# Patient Record
Sex: Male | Born: 1950 | Race: White | Hispanic: No | Marital: Married | Smoking: Never smoker
Health system: Southern US, Community
[De-identification: ages and names within clinical notes are randomized; demographics above are authoritative.]

## PROBLEM LIST (undated history)

## (undated) DIAGNOSIS — J189 Pneumonia, unspecified organism: Secondary | ICD-10-CM

## (undated) DIAGNOSIS — K219 Gastro-esophageal reflux disease without esophagitis: Secondary | ICD-10-CM

## (undated) DIAGNOSIS — C801 Malignant (primary) neoplasm, unspecified: Secondary | ICD-10-CM

## (undated) DIAGNOSIS — M1990 Unspecified osteoarthritis, unspecified site: Secondary | ICD-10-CM

## (undated) HISTORY — PX: OTHER SURGICAL HISTORY: SHX169

---

## 2012-12-16 HISTORY — PX: OTHER SURGICAL HISTORY: SHX169

## 2016-12-05 ENCOUNTER — Telehealth (INDEPENDENT_AMBULATORY_CARE_PROVIDER_SITE_OTHER): Payer: Self-pay | Admitting: *Deleted

## 2016-12-05 NOTE — Telephone Encounter (Signed)
Pt wife called stating pt has medicare part A and B now starting jan. 1

## 2016-12-06 NOTE — Progress Notes (Signed)
Surgery on 12/20/16.  Preop on 12/29/  Needs orders in EPIC Thank You

## 2016-12-06 NOTE — Telephone Encounter (Signed)
noted 

## 2016-12-10 NOTE — Progress Notes (Signed)
Has pre op 12/29-- PLEASE ADD SURGICAL ORDERS IN EPIC  THANKS

## 2016-12-11 ENCOUNTER — Other Ambulatory Visit (INDEPENDENT_AMBULATORY_CARE_PROVIDER_SITE_OTHER): Payer: Self-pay | Admitting: Orthopaedic Surgery

## 2016-12-11 DIAGNOSIS — M1612 Unilateral primary osteoarthritis, left hip: Secondary | ICD-10-CM

## 2016-12-11 NOTE — Progress Notes (Signed)
PLEASE ADD SURGICAL ORDERS IN EPIC-- HAS PRE OP 12/13/16 THANKS

## 2016-12-12 ENCOUNTER — Other Ambulatory Visit (HOSPITAL_COMMUNITY): Payer: Self-pay

## 2016-12-12 NOTE — Patient Instructions (Addendum)
Marcel Skramstad  12/12/2016   Your procedure is scheduled on: Friday 12/20/2016  Report to [facility] Main  Entrance take [place]  elevators to 3rd floor to  [place] at [phone].  Call this number if you have problems the morning of surgery [phone]   Remember: ONLY 1 PERSON MAY GO WITH YOU TO SHORT STAY TO GET  READY MORNING OF [place].  Do not eat food or drink liquids :After Midnight.     Take these medicines the morning of surgery with A SIP OF WATER: omeprazole (Prilosec) if needed.                                You may not have any metal on your body including hair pins and              piercings  Do not wear jewelry, make-up, lotions, powders or perfumes, deodorant             Do not wear nail polish.  Do not shave  48 hours prior to surgery.              Men may shave face and neck.   Do not bring valuables to the hospital. [place].  Contacts, dentures or bridgework may not be worn into surgery.  Leave suitcase in the car. After surgery it may be brought to your room.                Please read over the following fact sheets you were given: _____________________________________________________________________  WHAT IS A BLOOD TRANSFUSION? Blood Transfusion Information  A transfusion is the replacement of blood or some of its parts. Blood is made up of multiple cells which provide different functions.  Red blood cells carry oxygen and are used for blood loss replacement.  White blood cells fight against infection.  Platelets control bleeding.  Plasma helps clot blood.  Other blood products are available for specialized needs, such as hemophilia or other clotting disorders. BEFORE THE TRANSFUSION  Who gives blood for transfusions?   Healthy volunteers who are fully evaluated to make sure their blood is safe. This is blood bank blood. Transfusion therapy is the safest it has ever  been in the practice of medicine. Before blood is taken from a donor, a complete history is taken to make sure that person has no history of diseases nor engages in risky social behavior (examples are intravenous drug use or sexual activity with multiple partners). The donor's travel history is screened to minimize risk of transmitting infections, such as malaria. The donated blood is tested for signs of infectious diseases, such as HIV and hepatitis. The blood is then tested to be sure it is compatible with you in order to minimize the chance of a transfusion reaction. If you or a relative donates blood, this is often done in anticipation of surgery and is not appropriate for emergency situations. It takes many days to process the donated blood. RISKS AND COMPLICATIONS Although transfusion therapy is very safe and saves many lives, the main dangers of transfusion include:   Getting an infectious disease.  Developing a transfusion reaction. This is an allergic reaction to something in the blood you were given. Every  precaution is taken to prevent this. The decision to have a blood transfusion has been considered carefully by your caregiver before blood is given. Blood is not given unless the benefits outweigh the risks. AFTER THE TRANSFUSION  Right after receiving a blood transfusion, you will usually feel much better and more energetic. This is especially true if your red blood cells have gotten low (anemic). The transfusion raises the level of the red blood cells which carry oxygen, and this usually causes an energy increase.  The nurse administering the transfusion will monitor you carefully for complications. HOME CARE INSTRUCTIONS  No special instructions are needed after a transfusion. You may find your energy is better. Speak with your caregiver about any limitations on activity for underlying diseases you may have. SEEK MEDICAL CARE IF:   Your condition is not improving after your  transfusion.  You develop redness or irritation at the intravenous (IV) site. SEEK IMMEDIATE MEDICAL CARE IF:  Any of the following symptoms occur over the next 12 hours:  Shaking chills.  You have a temperature by mouth above 102 F (38.9 C), not controlled by medicine.  Chest, back, or muscle pain.  People around you feel you are not acting correctly or are confused.  Shortness of breath or difficulty breathing.  Dizziness and fainting.  You get a rash or develop hives.  You have a decrease in urine output.  Your urine turns a dark color or changes to pink, red, or brown. Any of the following symptoms occur over the next 10 days:  You have a temperature by mouth above 102 F (38.9 C), not controlled by medicine.  Shortness of breath.  Weakness after normal activity.  The white part of the eye turns yellow (jaundice).  You have a decrease in the amount of urine or are urinating less often.  Your urine turns a dark color or changes to pink, red, or brown. Document Released: 11/29/2000 Document Revised: 02/24/2012 Document Reviewed: 07/18/2008 ExitCare Patient Information 2014 [place].  _______________________________________________________________________            [facility] - Preparing for Surgery Before surgery, you can play an important role.  Because skin is not sterile, your skin needs to be as free of germs as possible.  You can reduce the number of germs on your skin by washing with CHG (chlorahexidine gluconate) soap before surgery.  CHG is an antiseptic cleaner which kills germs and bonds with the skin to continue killing germs even after washing. Please DO NOT use if you have an allergy to CHG or antibacterial soaps.  If your skin becomes reddened/irritated stop using the CHG and inform your nurse when you arrive at Short Stay. Do not shave (including legs and underarms) for at least 48 hours prior to the first CHG shower.  You may shave your  face/neck. Please follow these instructions carefully:  1.  Shower with CHG Soap the night before surgery and the  morning of Surgery.  2.  If you choose to wash your hair, wash your hair first as usual with your  normal  shampoo.  3.  After you shampoo, rinse your hair and body thoroughly to remove the  shampoo.                           4.  Use CHG as you would any other liquid soap.  You can apply chg directly  to the skin and wash  Gently with a scrungie or clean washcloth.  5.  Apply the CHG Soap to your body ONLY FROM THE NECK DOWN.   Do not use on face/ open                           Wound or open sores. Avoid contact with eyes, ears mouth and genitals (private parts).                       Wash face,  Genitals (private parts) with your normal soap.             6.  Wash thoroughly, paying special attention to the area where your surgery  will be performed.  7.  Thoroughly rinse your body with warm water from the neck down.  8.  DO NOT shower/wash with your normal soap after using and rinsing off  the CHG Soap.                9.  Pat yourself dry with a clean towel.            10.  Wear clean pajamas.            11.  Place clean sheets on your bed the night of your first shower and do not  sleep with pets. Day of Surgery : Do not apply any lotions/deodorants the morning of surgery.  Please wear clean clothes to the hospital/surgery center.    Incentive Spirometer  An incentive spirometer is a tool that can help keep your lungs clear and active. This tool measures how well you are filling your lungs with each breath. Taking long deep breaths may help reverse or decrease the chance of developing breathing (pulmonary) problems (especially infection) following:  A long period of time when you are unable to move or be active. BEFORE THE PROCEDURE   If the spirometer includes an indicator to show your best effort, your nurse or respiratory therapist will set it to a  desired goal.  If possible, sit up straight or lean slightly forward. Try not to slouch.  Hold the incentive spirometer in an upright position. INSTRUCTIONS FOR USE  1. Sit on the edge of your bed if possible, or sit up as far as you can in bed or on a chair. 2. Hold the incentive spirometer in an upright position. 3. Breathe out normally. 4. Place the mouthpiece in your mouth and seal your lips tightly around it. 5. Breathe in slowly and as deeply as possible, raising the piston or the ball toward the top of the column. 6. Hold your breath for 3-5 seconds or for as long as possible. Allow the piston or ball to fall to the bottom of the column. 7. Remove the mouthpiece from your mouth and breathe out normally. 8. Rest for a few seconds and repeat Steps 1 through 7 at least 10 times every 1-2 hours when you are awake. Take your time and take a few normal breaths between deep breaths. 9. The spirometer may include an indicator to show your best effort. Use the indicator as a goal to work toward during each repetition. 10. After each set of 10 deep breaths, practice coughing to be sure your lungs are clear. If you have an incision (the cut made at the time of surgery), support your incision when coughing by placing a pillow or rolled up towels firmly against it. Once you are able  to get out of bed, walk around indoors and cough well. You may stop using the incentive spirometer when instructed by your caregiver.  RISKS AND COMPLICATIONS  Take your time so you do not get dizzy or light-headed.  If you are in pain, you may need to take or ask for pain medication before doing incentive spirometry. It is harder to take a deep breath if you are having pain. AFTER USE  Rest and breathe slowly and easily.  It can be helpful to keep track of a log of your progress. Your caregiver can provide you with a simple table to help with this. If you are using the spirometer at home, follow these  instructions: [place] IF:   You are having difficultly using the spirometer.  You have trouble using the spirometer as often as instructed.  Your pain medication is not giving enough relief while using the spirometer.  You develop fever of 100.5 F (38.1 C) or higher. SEEK IMMEDIATE MEDICAL CARE IF:   You cough up bloody sputum that had not been present before.  You develop fever of 102 F (38.9 C) or greater.  You develop worsening pain at or near the incision site. MAKE SURE YOU:   Understand these instructions.  Will watch your condition.  Will get help right away if you are not doing well or get worse. Document Released: 04/14/2007 Document Revised: 02/24/2012 Document Reviewed: 06/15/2007 ExitCare Patient Information 2014 [place].   ________________________________________________________________________   FAILURE TO FOLLOW THESE INSTRUCTIONS MAY RESULT IN THE CANCELLATION OF YOUR SURGERY PATIENT SIGNATURE_________________________________  NURSE SIGNATURE__________________________________  ________________________________________________________________________

## 2016-12-13 ENCOUNTER — Encounter (HOSPITAL_COMMUNITY)
Admission: RE | Admit: 2016-12-13 | Discharge: 2016-12-13 | Disposition: A | Discharge status: Home / Self Care | Payer: BLUE CROSS/BLUE SHIELD | Source: Ambulatory Visit | Attending: Orthopaedic Surgery | Admitting: Orthopaedic Surgery

## 2016-12-13 ENCOUNTER — Encounter (HOSPITAL_COMMUNITY): Payer: Self-pay

## 2016-12-13 DIAGNOSIS — Z01812 Encounter for preprocedural laboratory examination: Secondary | ICD-10-CM | POA: Insufficient documentation

## 2016-12-13 DIAGNOSIS — M1612 Unilateral primary osteoarthritis, left hip: Secondary | ICD-10-CM | POA: Diagnosis not present

## 2016-12-13 HISTORY — DX: Gastro-esophageal reflux disease without esophagitis: K21.9

## 2016-12-13 HISTORY — DX: Pneumonia, unspecified organism: J18.9

## 2016-12-13 HISTORY — DX: Unspecified osteoarthritis, unspecified site: M19.90

## 2016-12-13 HISTORY — DX: Malignant (primary) neoplasm, unspecified: C80.1

## 2016-12-13 LAB — CBC
HEMATOCRIT: 46 % (ref 39.0–52.0)
HEMOGLOBIN: 15.5 g/dL (ref 13.0–17.0)
MCH: 30.4 pg (ref 26.0–34.0)
MCHC: 33.7 g/dL (ref 30.0–36.0)
MCV: 90.2 fL (ref 78.0–100.0)
Platelets: 251 10*3/uL (ref 150–400)
RBC: 5.1 MIL/uL (ref 4.22–5.81)
RDW: 13.2 % (ref 11.5–15.5)
WBC: 12.6 10*3/uL — ABNORMAL HIGH (ref 4.0–10.5)

## 2016-12-13 LAB — BASIC METABOLIC PANEL
ANION GAP: 7 (ref 5–15)
BUN: 17 mg/dL (ref 6–20)
CALCIUM: 9 mg/dL (ref 8.9–10.3)
CHLORIDE: 102 mmol/L (ref 101–111)
CO2: 28 mmol/L (ref 22–32)
Creatinine, Ser: 1.16 mg/dL (ref 0.61–1.24)
GFR calc non Af Amer: >60 mL/min (ref >60)
GLUCOSE: 75 mg/dL (ref 65–99)
POTASSIUM: 4.8 mmol/L (ref 3.5–5.1)
Sodium: 137 mmol/L (ref 135–145)

## 2016-12-13 LAB — SURGICAL PCR SCREEN
MRSA, PCR: NEGATIVE
Staphylococcus aureus: NEGATIVE

## 2016-12-13 LAB — ABO/RH: ABO/RH(D): A NEG

## 2016-12-20 ENCOUNTER — Inpatient Hospital Stay (HOSPITAL_COMMUNITY)
Admission: RE | Admit: 2016-12-20 | Discharge: 2016-12-22 | DRG: 470 | Disposition: A | Discharge status: Home Health | Payer: BLUE CROSS/BLUE SHIELD | Source: Ambulatory Visit | Attending: Orthopaedic Surgery | Admitting: Orthopaedic Surgery

## 2016-12-20 ENCOUNTER — Encounter (HOSPITAL_COMMUNITY): Payer: Self-pay | Admitting: *Deleted

## 2016-12-20 ENCOUNTER — Inpatient Hospital Stay (HOSPITAL_COMMUNITY): Payer: BLUE CROSS/BLUE SHIELD | Admitting: Certified Registered Nurse Anesthetist

## 2016-12-20 ENCOUNTER — Inpatient Hospital Stay (HOSPITAL_COMMUNITY): Payer: BLUE CROSS/BLUE SHIELD

## 2016-12-20 ENCOUNTER — Encounter (HOSPITAL_COMMUNITY)
Admission: RE | Disposition: A | Discharge status: Home Health | Payer: Self-pay | Source: Ambulatory Visit | Attending: Orthopaedic Surgery

## 2016-12-20 DIAGNOSIS — M1612 Unilateral primary osteoarthritis, left hip: Secondary | ICD-10-CM | POA: Diagnosis present

## 2016-12-20 DIAGNOSIS — Z419 Encounter for procedure for purposes other than remedying health state, unspecified: Secondary | ICD-10-CM

## 2016-12-20 DIAGNOSIS — Z79899 Other long term (current) drug therapy: Secondary | ICD-10-CM

## 2016-12-20 DIAGNOSIS — K219 Gastro-esophageal reflux disease without esophagitis: Secondary | ICD-10-CM | POA: Diagnosis present

## 2016-12-20 DIAGNOSIS — Z85828 Personal history of other malignant neoplasm of skin: Secondary | ICD-10-CM

## 2016-12-20 DIAGNOSIS — Z96642 Presence of left artificial hip joint: Secondary | ICD-10-CM

## 2016-12-20 DIAGNOSIS — M25552 Pain in left hip: Secondary | ICD-10-CM | POA: Diagnosis present

## 2016-12-20 HISTORY — PX: TOTAL HIP ARTHROPLASTY: SHX124

## 2016-12-20 SURGERY — ARTHROPLASTY, HIP, TOTAL, ANTERIOR APPROACH
Anesthesia: Spinal | Site: Hip | Laterality: Left

## 2016-12-20 MED ORDER — MIDAZOLAM HCL 2 MG/2ML IJ SOLN
INTRAMUSCULAR | Status: AC
  Filled 2016-12-20: qty 2

## 2016-12-20 MED ORDER — ASPIRIN 81 MG PO CHEW
81.0000 mg | CHEWABLE_TABLET | Freq: Two times a day (BID) | ORAL | Status: DC
Start: 2016-12-20 — End: 2016-12-22
  Administered 2016-12-20 – 2016-12-22 (×4): 81 mg via ORAL
  Filled 2016-12-20 (×4): qty 1

## 2016-12-20 MED ORDER — CEFAZOLIN SODIUM-DEXTROSE 2-4 GM/100ML-% IV SOLN
2.0000 g | INTRAVENOUS | Status: AC
Start: 2016-12-20 — End: 2016-12-20
  Administered 2016-12-20: 2 g via INTRAVENOUS

## 2016-12-20 MED ORDER — MIDAZOLAM HCL 5 MG/5ML IJ SOLN
INTRAMUSCULAR | Status: DC | PRN
  Administered 2016-12-20: 2 mg via INTRAVENOUS

## 2016-12-20 MED ORDER — CEFAZOLIN IN D5W 1 GM/50ML IV SOLN
1.0000 g | Freq: Four times a day (QID) | INTRAVENOUS | Status: AC
  Administered 2016-12-20 (×2): 1 g via INTRAVENOUS
  Filled 2016-12-20 (×2): qty 50

## 2016-12-20 MED ORDER — PHENOL 1.4 % MT LIQD
1.0000 | OROMUCOSAL | Status: DC | PRN
  Filled 2016-12-20: qty 177

## 2016-12-20 MED ORDER — SODIUM CHLORIDE 0.9 % IR SOLN
Status: DC | PRN
  Administered 2016-12-20: 1000 mL

## 2016-12-20 MED ORDER — PHENYLEPHRINE HCL 10 MG/ML IJ SOLN
INTRAMUSCULAR | Status: DC | PRN
  Administered 2016-12-20 (×3): 80 ug via INTRAVENOUS

## 2016-12-20 MED ORDER — ONDANSETRON HCL 4 MG PO TABS: 4.0000 mg | ORAL_TABLET | Freq: Four times a day (QID) | ORAL | Status: DC | PRN

## 2016-12-20 MED ORDER — ONDANSETRON HCL 4 MG/2ML IJ SOLN
4.0000 mg | Freq: Four times a day (QID) | INTRAMUSCULAR | Status: DC | PRN
  Administered 2016-12-20 – 2016-12-21 (×2): 4 mg via INTRAVENOUS
  Filled 2016-12-20 (×2): qty 2

## 2016-12-20 MED ORDER — PANTOPRAZOLE SODIUM 40 MG PO TBEC
40.0000 mg | DELAYED_RELEASE_TABLET | Freq: Every day | ORAL | Status: DC
  Administered 2016-12-21 – 2016-12-22 (×2): 40 mg via ORAL
  Filled 2016-12-20 (×2): qty 1

## 2016-12-20 MED ORDER — CEFAZOLIN SODIUM-DEXTROSE 2-4 GM/100ML-% IV SOLN
INTRAVENOUS | Status: AC
  Filled 2016-12-20: qty 100

## 2016-12-20 MED ORDER — TRANEXAMIC ACID 1000 MG/10ML IV SOLN
1000.0000 mg | INTRAVENOUS | Status: AC
  Administered 2016-12-20: 1000 mg via INTRAVENOUS
  Filled 2016-12-20: qty 10

## 2016-12-20 MED ORDER — SODIUM CHLORIDE 0.9 % IV SOLN
INTRAVENOUS | Status: DC
  Administered 2016-12-20 (×2): 75 mL/h via INTRAVENOUS

## 2016-12-20 MED ORDER — METOCLOPRAMIDE HCL 5 MG/ML IJ SOLN
INTRAMUSCULAR | Status: AC
  Filled 2016-12-20: qty 2

## 2016-12-20 MED ORDER — LACTATED RINGERS IV SOLN
INTRAVENOUS | Status: DC
  Administered 2016-12-20: 10:00:00 via INTRAVENOUS

## 2016-12-20 MED ORDER — OXYCODONE HCL 5 MG PO TABS
5.0000 mg | ORAL_TABLET | ORAL | Status: DC | PRN
  Administered 2016-12-20: 21:00:00 5 mg via ORAL
  Administered 2016-12-20: 10 mg via ORAL
  Administered 2016-12-20: 12:00:00 5 mg via ORAL
  Administered 2016-12-20: 16:00:00 10 mg via ORAL
  Administered 2016-12-20 – 2016-12-21 (×2): 5 mg via ORAL
  Filled 2016-12-20: qty 2
  Filled 2016-12-20: qty 1
  Filled 2016-12-20: qty 2
  Filled 2016-12-20 (×3): qty 1

## 2016-12-20 MED ORDER — METOCLOPRAMIDE HCL 5 MG/ML IJ SOLN
10.0000 mg | Freq: Once | INTRAMUSCULAR | Status: AC | PRN
  Administered 2016-12-20: 10 mg via INTRAVENOUS

## 2016-12-20 MED ORDER — FENTANYL CITRATE (PF) 100 MCG/2ML IJ SOLN
INTRAMUSCULAR | Status: AC
  Filled 2016-12-20: qty 2

## 2016-12-20 MED ORDER — ACETAMINOPHEN 325 MG PO TABS
650.0000 mg | ORAL_TABLET | Freq: Four times a day (QID) | ORAL | Status: DC | PRN
  Administered 2016-12-21 – 2016-12-22 (×4): 650 mg via ORAL
  Filled 2016-12-20 (×4): qty 2

## 2016-12-20 MED ORDER — METHOCARBAMOL 500 MG PO TABS
500.0000 mg | ORAL_TABLET | Freq: Four times a day (QID) | ORAL | Status: DC | PRN
  Administered 2016-12-20 – 2016-12-22 (×5): 500 mg via ORAL
  Filled 2016-12-20 (×5): qty 1

## 2016-12-20 MED ORDER — METOCLOPRAMIDE HCL 5 MG PO TABS: 5.0000 mg | ORAL_TABLET | Freq: Three times a day (TID) | ORAL | Status: DC | PRN

## 2016-12-20 MED ORDER — DOCUSATE SODIUM 100 MG PO CAPS
100.0000 mg | ORAL_CAPSULE | Freq: Two times a day (BID) | ORAL | Status: DC
  Administered 2016-12-20 – 2016-12-22 (×5): 100 mg via ORAL
  Filled 2016-12-20 (×5): qty 1

## 2016-12-20 MED ORDER — ACETAMINOPHEN 650 MG RE SUPP: 650.0000 mg | Freq: Four times a day (QID) | RECTAL | Status: DC | PRN

## 2016-12-20 MED ORDER — DEXTROSE 5 % IV SOLN
500.0000 mg | Freq: Four times a day (QID) | INTRAVENOUS | Status: DC | PRN
  Administered 2016-12-20: 500 mg via INTRAVENOUS
  Filled 2016-12-20: qty 550
  Filled 2016-12-20: qty 5

## 2016-12-20 MED ORDER — PROPOFOL 10 MG/ML IV BOLUS
INTRAVENOUS | Status: AC
  Filled 2016-12-20: qty 20

## 2016-12-20 MED ORDER — MEPERIDINE HCL 50 MG/ML IJ SOLN
INTRAMUSCULAR | Status: AC
  Filled 2016-12-20: qty 1

## 2016-12-20 MED ORDER — DIPHENHYDRAMINE HCL 12.5 MG/5ML PO ELIX: 12.5000 mg | ORAL_SOLUTION | ORAL | Status: DC | PRN

## 2016-12-20 MED ORDER — FENTANYL CITRATE (PF) 100 MCG/2ML IJ SOLN: 25.0000 ug | INTRAMUSCULAR | Status: DC | PRN

## 2016-12-20 MED ORDER — HYDROMORPHONE HCL 1 MG/ML IJ SOLN: 1.0000 mg | INTRAMUSCULAR | Status: DC | PRN

## 2016-12-20 MED ORDER — MEPERIDINE HCL 50 MG/ML IJ SOLN
6.2500 mg | INTRAMUSCULAR | Status: DC | PRN
  Administered 2016-12-20: 12.5 mg via INTRAVENOUS

## 2016-12-20 MED ORDER — METOCLOPRAMIDE HCL 5 MG/ML IJ SOLN: 5.0000 mg | Freq: Three times a day (TID) | INTRAMUSCULAR | Status: DC | PRN

## 2016-12-20 MED ORDER — BUPIVACAINE HCL (PF) 0.5 % IJ SOLN
INTRAMUSCULAR | Status: DC | PRN
  Administered 2016-12-20: 3 mL

## 2016-12-20 MED ORDER — KETOROLAC TROMETHAMINE 15 MG/ML IJ SOLN
7.5000 mg | Freq: Four times a day (QID) | INTRAMUSCULAR | Status: AC
  Administered 2016-12-20 – 2016-12-21 (×4): 7.5 mg via INTRAVENOUS
  Filled 2016-12-20 (×4): qty 1

## 2016-12-20 MED ORDER — ALUM & MAG HYDROXIDE-SIMETH 200-200-20 MG/5ML PO SUSP: 30.0000 mL | ORAL | Status: DC | PRN

## 2016-12-20 MED ORDER — FENTANYL CITRATE (PF) 100 MCG/2ML IJ SOLN
INTRAMUSCULAR | Status: DC | PRN
  Administered 2016-12-20 (×2): 50 ug via INTRAVENOUS

## 2016-12-20 MED ORDER — LACTATED RINGERS IV SOLN
INTRAVENOUS | Status: DC | PRN
  Administered 2016-12-20 (×2): via INTRAVENOUS

## 2016-12-20 MED ORDER — STERILE WATER FOR IRRIGATION IR SOLN
Status: DC | PRN
  Administered 2016-12-20: 2000 mL

## 2016-12-20 MED ORDER — PROPOFOL 500 MG/50ML IV EMUL
INTRAVENOUS | Status: DC | PRN
  Administered 2016-12-20: 75 ug/kg/min via INTRAVENOUS

## 2016-12-20 MED ORDER — MENTHOL 3 MG MT LOZG: 1.0000 | LOZENGE | OROMUCOSAL | Status: DC | PRN

## 2016-12-20 MED ORDER — PROPOFOL 10 MG/ML IV BOLUS
INTRAVENOUS | Status: AC
  Filled 2016-12-20: qty 40

## 2016-12-20 SURGICAL SUPPLY — 38 items
BENZOIN TINCTURE PRP APPL 2/3 (GAUZE/BANDAGES/DRESSINGS) ×2 IMPLANT
BLADE SAW SGTL 18X1.27X75 (BLADE) ×2 IMPLANT
CAPT HIP TOTAL 2 ×2 IMPLANT
CELLS DAT CNTRL 66122 CELL SVR (MISCELLANEOUS) ×1 IMPLANT
CLOTH BEACON ORANGE TIMEOUT ST (SAFETY) ×2 IMPLANT
COVER PERINEAL POST (MISCELLANEOUS) ×2 IMPLANT
DRAPE STERI IOBAN 125X83 (DRAPES) ×2 IMPLANT
DRAPE U-SHAPE 47X51 STRL (DRAPES) ×4 IMPLANT
DRSG AQUACEL AG ADV 3.5X10 (GAUZE/BANDAGES/DRESSINGS) ×2 IMPLANT
DURAPREP 26ML APPLICATOR (WOUND CARE) ×2 IMPLANT
ELECT REM PT RETURN 9FT ADLT (ELECTROSURGICAL) ×2
ELECTRODE REM PT RTRN 9FT ADLT (ELECTROSURGICAL) ×1 IMPLANT
GLOVE BIO SURGEON STRL SZ7.5 (GLOVE) ×2 IMPLANT
GLOVE BIOGEL PI IND STRL 7.0 (GLOVE) ×1 IMPLANT
GLOVE BIOGEL PI IND STRL 7.5 (GLOVE) ×3 IMPLANT
GLOVE BIOGEL PI IND STRL 8 (GLOVE) ×2 IMPLANT
GLOVE BIOGEL PI INDICATOR 7.0 (GLOVE) ×1
GLOVE BIOGEL PI INDICATOR 7.5 (GLOVE) ×3
GLOVE BIOGEL PI INDICATOR 8 (GLOVE) ×2
GLOVE ECLIPSE 8.0 STRL XLNG CF (GLOVE) ×2 IMPLANT
GLOVE SURG SS PI 7.0 STRL IVOR (GLOVE) ×2 IMPLANT
GLOVE SURG SS PI 7.5 STRL IVOR (GLOVE) ×2 IMPLANT
GOWN STRL REUS W/ TWL XL LVL3 (GOWN DISPOSABLE) ×1 IMPLANT
GOWN STRL REUS W/TWL XL LVL3 (GOWN DISPOSABLE) ×7 IMPLANT
HANDPIECE INTERPULSE COAX TIP (DISPOSABLE) ×1
HOLDER FOLEY CATH W/STRAP (MISCELLANEOUS) ×2 IMPLANT
PACK ANTERIOR HIP CUSTOM (KITS) ×2 IMPLANT
RTRCTR WOUND ALEXIS 18CM MED (MISCELLANEOUS) ×2
SET HNDPC FAN SPRY TIP SCT (DISPOSABLE) ×1 IMPLANT
STRIP CLOSURE SKIN 1/2X4 (GAUZE/BANDAGES/DRESSINGS) ×2 IMPLANT
SUT ETHIBOND NAB CT1 #1 30IN (SUTURE) ×2 IMPLANT
SUT MNCRL AB 4-0 PS2 18 (SUTURE) ×2 IMPLANT
SUT VIC AB 0 CT1 36 (SUTURE) ×2 IMPLANT
SUT VIC AB 1 CT1 36 (SUTURE) ×2 IMPLANT
SUT VIC AB 2-0 CT1 27 (SUTURE) ×2
SUT VIC AB 2-0 CT1 TAPERPNT 27 (SUTURE) ×2 IMPLANT
TRAY FOLEY W/METER SILVER 16FR (SET/KITS/TRAYS/PACK) ×2 IMPLANT
YANKAUER SUCT BULB TIP 10FT TU (MISCELLANEOUS) ×2 IMPLANT

## 2016-12-20 NOTE — Transfer of Care (Signed)
Immediate Anesthesia Transfer of Care Note  Patient: Mathew Perez  Procedure(s) Performed: Procedure(s): LEFT TOTAL HIP ARTHROPLASTY ANTERIOR APPROACH (Left)  Patient Location: PACU  Anesthesia Type:Spinal  Level of Consciousness: awake, alert  and oriented  Airway & Oxygen Therapy: Patient Spontanous Breathing and Patient connected to face mask oxygen  Post-op Assessment: Report given to RN and Post -op Vital signs reviewed and stable  Post vital signs: Reviewed and stable  Last Vitals:  Vitals:   12/20/16 0545  BP: (!) 144/95  Pulse: 87  Resp: 18  Temp: 36.4 C    Last Pain:  Vitals:   12/20/16 0545  TempSrc: Oral  PainSc: 2       Patients Stated Pain Goal: 4 ([id number])  Complications: No apparent anesthesia complications

## 2016-12-20 NOTE — Brief Op Note (Signed)
12/20/2016  8:55 AM  PATIENT:  Bishop Dublin  66 y.o. male  PRE-OPERATIVE DIAGNOSIS:  severe osteoarthritis left hip  POST-OPERATIVE DIAGNOSIS:  severe osteoarthritis left hip  PROCEDURE:  Procedure(s): LEFT TOTAL HIP ARTHROPLASTY ANTERIOR APPROACH (Left)  SURGEON:  Surgeon(s) and Role:    * Mcarthur Rossetti, MD - Primary  PHYSICIAN ASSISTANT: Benita Stabile, PA-C  ANESTHESIA:   spinal  EBL:  Total I/O In: 1000 [I.V.:1000] Out: 500 [Urine:200; Blood:300]  COUNTS:  YES  TOURNIQUET:  * No tourniquets in log *  DICTATION: .Other Dictation: Dictation Number [id number]  PLAN OF CARE: Admit to inpatient   PATIENT DISPOSITION:  PACU - hemodynamically stable.   Delay start of Pharmacological VTE agent (>24hrs) due to surgical blood loss or risk of bleeding: no

## 2016-12-20 NOTE — Anesthesia Procedure Notes (Signed)
Spinal  Start time: 12/20/2016 7:35 AM End time: 12/20/2016 7:41 AM Staffing Resident/CRNA: Gean Maidens Performed: resident/CRNA  Preanesthetic Checklist Completed: patient identified, site marked, surgical consent, pre-op evaluation, timeout performed, IV checked, risks and benefits discussed and monitors and equipment checked Spinal Block Patient position: sitting Prep: Betadine Patient monitoring: heart rate, continuous pulse ox and blood pressure Approach: midline Location: L4-5 Injection technique: single-shot Needle Needle type: Spinocan  Needle gauge: 22 G Needle length: 9 cm Needle insertion depth: 6 cm Additional Notes Pt sitting position, sterile prep and drape. Negative paresthesia, negative heme.

## 2016-12-20 NOTE — Evaluation (Signed)
Physical Therapy Evaluation Patient Details Name: Mathew Perez MRN: [id number] DOB: 04/14/1951 Today's Date: 12/20/2016   History of Present Illness  Pt s/p L THR  Clinical Impression  Pt s/p L THR presents with decreased L LE strength/ROM and post op pain limiting functional mobility.  Pt should progress to dc home with family assist and HHPT follow up.    Follow Up Recommendations Home health PT    Equipment Recommendations  Rolling walker with 5" wheels    Recommendations for Other Services OT consult     Precautions / Restrictions Precautions Precautions: Fall Restrictions Weight Bearing Restrictions: No Other Position/Activity Restrictions: WBAT      Mobility  Bed Mobility Overal bed mobility: Needs Assistance Bed Mobility: Supine to Sit     Supine to sit: Min assist;+2 for physical assistance;+2 for safety/equipment     General bed mobility comments: cues for sequence and use of R LE to self assist  Transfers Overall transfer level: Needs assistance Equipment used: Rolling walker (2 wheeled) Transfers: Sit to/from Stand Sit to Stand: Min assist;From elevated surface         General transfer comment: cues for LE management and use of UEs to self assist  Ambulation/Gait Ambulation/Gait assistance: Min assist Ambulation Distance (Feet): 34 Feet Assistive device: Rolling walker (2 wheeled) Gait Pattern/deviations: Step-to pattern;Decreased step length - right;Decreased step length - left;Shuffle;Trunk flexed Gait velocity: decr Gait velocity interpretation: Below normal speed for age/gender General Gait Details: cues for sequence, posture and position from [employer]            Wheelchair Mobility    Modified Rankin (Stroke Patients Only)       Balance                                             Pertinent Vitals/Pain Pain Assessment: 0-10 Pain Score: 5  Pain Location: L hip Pain Descriptors / Indicators:  Aching;Sore Pain Intervention(s): Limited activity within patient's tolerance;Monitored during session;Premedicated before session;Ice applied    Home Living Family/patient expects to be discharged to:: Private residence Living Arrangements: Spouse/significant other Available Help at Discharge: Family Type of Home: House Home Access: Stairs to enter   Technical brewer of Steps: 1 Home Layout: One level Home Equipment: None      Prior Function Level of Independence: Independent               Hand Dominance        Extremity/Trunk Assessment   Upper Extremity Assessment Upper Extremity Assessment: Overall WFL for tasks assessed    Lower Extremity Assessment Lower Extremity Assessment: LLE deficits/detail    Cervical / Trunk Assessment Cervical / Trunk Assessment: Normal  Communication   Communication: No difficulties  Cognition Arousal/Alertness: Awake/alert Behavior During Therapy: WFL for tasks assessed/performed Overall Cognitive Status: Within Functional Limits for tasks assessed                      General Comments      Exercises Total Joint Exercises Ankle Circles/Pumps: AROM;Both;15 reps;Supine   Assessment/Plan    PT Assessment Patient needs continued PT services  PT Problem List Decreased strength;Decreased range of motion;Decreased activity tolerance;Decreased balance;Decreased mobility;Decreased knowledge of use of DME;Pain          PT Treatment Interventions DME instruction;Gait training;Stair training;Functional mobility training;Therapeutic activities;Therapeutic exercise;Patient/family education  PT Goals (Current goals can be found in the Care Plan section)  Acute Rehab PT Goals Patient Stated Goal: Regain IND PT Goal Formulation: With patient Time For Goal Achievement: 12/23/16 Potential to Achieve Goals: Good    Frequency 7X/week   Barriers to discharge        Co-evaluation               End of  Session Equipment Utilized During Treatment: Gait belt Activity Tolerance: Patient tolerated treatment well Patient left: in chair;with call bell/phone within reach;with chair alarm set;with family/visitor present Nurse Communication: Mobility status         Time: [id number] PT Time Calculation (min) (ACUTE ONLY): 34 min   Charges:   PT Evaluation $PT Eval Low Complexity: 1 Procedure PT Treatments $Gait Training: 8-22 mins   PT G Codes:        Tahsin Benyo 2016/12/25, 5:06 PM

## 2016-12-20 NOTE — Anesthesia Preprocedure Evaluation (Signed)
Anesthesia Evaluation  Patient identified by MRN, date of birth, ID band Patient awake    Reviewed: Allergy & Precautions, NPO status , Patient's Chart, lab work & pertinent test results  Airway Mallampati: II  TM Distance: >3 FB Neck ROM: Full    Dental no notable dental hx.    Pulmonary neg pulmonary ROS,    Pulmonary exam normal breath sounds clear to auscultation       Cardiovascular negative cardio ROS Normal cardiovascular exam Rhythm:Regular Rate:Normal     Neuro/Psych negative neurological ROS  negative psych ROS   GI/Hepatic negative GI ROS, Neg liver ROS,   Endo/Other  negative endocrine ROS  Renal/GU negative Renal ROS  negative genitourinary   Musculoskeletal negative musculoskeletal ROS (+)   Abdominal   Peds negative pediatric ROS (+)  Hematology negative hematology ROS (+)   Anesthesia Other Findings   Reproductive/Obstetrics negative OB ROS                            Anesthesia Physical Anesthesia Plan  ASA: II  Anesthesia Plan: Spinal   Post-op Pain Management:    Induction:   Airway Management Planned: Simple Face Mask  Additional Equipment:   Intra-op Plan:   Post-operative Plan: Extubation in OR  Informed Consent: I have reviewed the patients History and Physical, chart, labs and discussed the procedure including the risks, benefits and alternatives for the proposed anesthesia with the patient or authorized representative who has indicated his/her understanding and acceptance.   Dental advisory given  Plan Discussed with: CRNA  Anesthesia Plan Comments:         Anesthesia Quick Evaluation

## 2016-12-20 NOTE — Anesthesia Postprocedure Evaluation (Signed)
Anesthesia Post Note  Patient: Mathew Perez  Procedure(s) Performed: Procedure(s) (LRB): LEFT TOTAL HIP ARTHROPLASTY ANTERIOR APPROACH (Left)  Patient location during evaluation: PACU Anesthesia Type: Spinal Level of consciousness: awake and alert Pain management: pain level controlled Vital Signs Assessment: post-procedure vital signs reviewed and stable Respiratory status: spontaneous breathing and respiratory function stable Cardiovascular status: blood pressure returned to baseline and stable Postop Assessment: no headache, no backache and spinal receding Anesthetic complications: no       Last Vitals:  Vitals:   12/20/16 1115 12/20/16 1125  BP:  122/75  Pulse: 67   Resp: 12 14  Temp:  36.5 C    Last Pain:  Vitals:   12/20/16 0545  TempSrc: Oral  PainSc: 2     LLE Motor Response: Responds to commands (12/20/16 1115) LLE Sensation: Decreased (due to spinal) (12/20/16 1115) RLE Motor Response: Responds to commands (12/20/16 1115) RLE Sensation: Decreased (due to spinal) (12/20/16 1115) L Sensory Level: L4-Anterior knee, lower leg (12/20/16 1115) R Sensory Level: L5-Outer lower leg, top of foot, great toe (12/20/16 1115)  Montez Hageman

## 2016-12-20 NOTE — H&P (Addendum)
TOTAL HIP ADMISSION H&P  Patient is admitted for left total hip arthroplasty.  Subjective:  Chief Complaint: left hip pain  HPI: Mathew Perez, 66 y.o. male, has a history of pain and functional disability in the left hip(s) due to arthritis and patient has failed non-surgical conservative treatments for greater than 12 weeks to include NSAID's and/or analgesics, corticosteriod injections and activity modification.  Onset of symptoms was gradual starting 2 years ago with gradually worsening course since that time.The patient noted no past surgery on the left hip(s).  Patient currently rates pain in the left hip at 10 out of 10 with activity. Patient has night pain, worsening of pain with activity and weight bearing, trendelenberg gait, pain that interfers with activities of daily living, pain with passive range of motion and crepitus. Patient has evidence of subchondral cysts, subchondral sclerosis, periarticular osteophytes and joint space narrowing by imaging studies. This condition presents safety issues increasing the risk of falls.  There is no current active infection.  Patient Active Problem List   Diagnosis Date Noted  . Unilateral primary osteoarthritis, left hip 12/20/2016   Past Medical History:  Diagnosis Date  . Arthritis    oa  . Cancer ([place])    basal cell removed fro face 4 yrs ago  . GERD (gastroesophageal reflux disease)   . Pneumonia as child    Past Surgical History:  Procedure Laterality Date  . colonscopy  2014   every 5 yrs due to polyps  . left rotator cuff repair  6 yrs ago    Prescriptions Prior to Admission  Medication Sig Dispense Refill Last Dose  . diclofenac (VOLTAREN) 75 MG EC tablet Take 75 mg by mouth 2 (two) times daily as needed for mild pain.   Past Week at Unknown time  . ibuprofen (ADVIL,MOTRIN) 800 MG tablet Take 800 mg by mouth daily as needed for mild pain.   Past Month at Unknown time  . omeprazole (PRILOSEC) 40 MG capsule Take 40 mg by mouth  daily as needed (heartburn).   12/20/2016 at 0430   No Known Allergies  Social History  Substance Use Topics  . Smoking status: Never Smoker  . Smokeless tobacco: Never Used  . Alcohol use No    History reviewed. No pertinent family history.   Review of Systems  Musculoskeletal: Positive for joint pain.  All other systems reviewed and are negative.   Objective:  Physical Exam  Constitutional: He is oriented to person, place, and time. He appears well-developed and well-nourished.  HENT:  Head: Normocephalic and atraumatic.  Eyes: EOM are normal. Pupils are equal, round, and reactive to light.  Neck: Normal range of motion. Neck supple.  Cardiovascular: Normal rate and regular rhythm.   Respiratory: Effort normal and breath sounds normal.  GI: Soft. Bowel sounds are normal.  Musculoskeletal:       Left hip: He exhibits decreased range of motion, decreased strength, tenderness and bony tenderness.  Neurological: He is alert and oriented to person, place, and time.  Skin: Skin is warm and dry.  Psychiatric: He has a normal mood and affect.    Vital signs in last 24 hours: Temp:  [97.6 F (36.4 C)] 97.6 F (36.4 C) (01/05 0545) Pulse Rate:  [87] 87 (01/05 0545) Resp:  [18] 18 (01/05 0545) BP: (144)/(95) 144/95 (01/05 0545) SpO2:  [100 %] 100 % (01/05 0545) Weight:  [200 lb (90.7 kg)] 200 lb (90.7 kg) (01/05 0545)  Labs:   Estimated body mass  index is 27.12 kg/m as calculated from the following:   Height as of this encounter: 6' (1.829 m).   Weight as of this encounter: 200 lb (90.7 kg).   Imaging Review Plain radiographs demonstrate severe degenerative joint disease of the left hip(s). The bone quality appears to be excellent for age and reported activity level.  Assessment/Plan:  End stage arthritis, left hip(s)  The patient history, physical examination, clinical judgement of the provider and imaging studies are consistent with end stage degenerative joint  disease of the left hip(s) and total hip arthroplasty is deemed medically necessary. The treatment options including medical management, injection therapy, arthroscopy and arthroplasty were discussed at length. The risks and benefits of total hip arthroplasty were presented and reviewed. The risks due to aseptic loosening, infection, stiffness, dislocation/subluxation,  thromboembolic complications and other imponderables were discussed.  The patient acknowledged the explanation, agreed to proceed with the plan and consent was signed. Patient is being admitted for inpatient treatment for surgery, pain control, PT, OT, prophylactic antibiotics, VTE prophylaxis, progressive ambulation and ADL's and discharge planning.The patient is planning to be discharged home with home health services

## 2016-12-21 LAB — BASIC METABOLIC PANEL
ANION GAP: 6 (ref 5–15)
BUN: 18 mg/dL (ref 6–20)
CALCIUM: 7.7 mg/dL — AB (ref 8.9–10.3)
CO2: 26 mmol/L (ref 22–32)
Chloride: 103 mmol/L (ref 101–111)
Creatinine, Ser: 0.92 mg/dL (ref 0.61–1.24)
GFR calc Af Amer: >60 mL/min (ref >60)
GLUCOSE: 103 mg/dL — AB (ref 65–99)
POTASSIUM: 4 mmol/L (ref 3.5–5.1)
SODIUM: 135 mmol/L (ref 135–145)

## 2016-12-21 LAB — CBC
HCT: 33.8 % — ABNORMAL LOW (ref 39.0–52.0)
Hemoglobin: 11.4 g/dL — ABNORMAL LOW (ref 13.0–17.0)
MCH: 30.6 pg (ref 26.0–34.0)
MCHC: 33.7 g/dL (ref 30.0–36.0)
MCV: 90.6 fL (ref 78.0–100.0)
PLATELETS: 211 10*3/uL (ref 150–400)
RBC: 3.73 MIL/uL — AB (ref 4.22–5.81)
RDW: 13.5 % (ref 11.5–15.5)
WBC: 8.1 10*3/uL (ref 4.0–10.5)

## 2016-12-21 MED ORDER — OXYCODONE-ACETAMINOPHEN 5-325 MG PO TABS: 1.0000 | ORAL_TABLET | ORAL | 0 refills | Status: AC | PRN

## 2016-12-21 MED ORDER — PROMETHAZINE HCL 12.5 MG PO TABS: 12.5000 mg | ORAL_TABLET | Freq: Three times a day (TID) | ORAL | 0 refills | Status: AC | PRN

## 2016-12-21 MED ORDER — METHOCARBAMOL 500 MG PO TABS: 500.0000 mg | ORAL_TABLET | Freq: Four times a day (QID) | ORAL | 0 refills | Status: AC | PRN

## 2016-12-21 MED ORDER — TRAMADOL HCL 50 MG PO TABS
100.0000 mg | ORAL_TABLET | Freq: Four times a day (QID) | ORAL | Status: DC | PRN
  Administered 2016-12-21 – 2016-12-22 (×3): 100 mg via ORAL
  Filled 2016-12-21 (×3): qty 2

## 2016-12-21 MED ORDER — HYDROCODONE-ACETAMINOPHEN 5-325 MG PO TABS
1.0000 | ORAL_TABLET | ORAL | Status: DC | PRN
Start: 2016-12-21 — End: 2016-12-22

## 2016-12-21 MED ORDER — ASPIRIN 81 MG PO CHEW: 81.0000 mg | CHEWABLE_TABLET | Freq: Two times a day (BID) | ORAL | 0 refills | Status: AC

## 2016-12-21 NOTE — Evaluation (Signed)
Occupational Therapy Evaluation Patient Details Name: Mathew Perez MRN: [id number] DOB: 1951/12/01 Today's Date: 12/21/2016    History of Present Illness Pt s/p L THR   Clinical Impression   Pt is s/p THA resulting in the deficits listed below (see OT Problem List).  Pt will benefit from skilled OT to increase their safety and independence with ADL and functional mobility for ADL to facilitate discharge to venue listed below.        Follow Up Recommendations  No OT follow up    Equipment Recommendations  3 in 1 bedside commode    Recommendations for Other Services       Precautions / Restrictions Precautions Precautions: Fall Restrictions Weight Bearing Restrictions: No Other Position/Activity Restrictions: WBAT      Mobility Bed Mobility Overal bed mobility: Needs Assistance Bed Mobility: Supine to Sit     Supine to sit: Min assist     General bed mobility comments: pt in chair  Transfers Overall transfer level: Needs assistance Equipment used: Rolling walker (2 wheeled) Transfers: Sit to/from [facility] Sit to Stand: Min guard Stand pivot transfers: Min guard       General transfer comment: VC fpr hand placement    Balance Overall balance assessment: No apparent balance deficits (not formally assessed)                                          ADL Overall ADL's : Needs assistance/impaired Eating/Feeding: Set up;Sitting   Grooming: Set up;Sitting   Upper Body Bathing: Set up;Sitting   Lower Body Bathing: Moderate assistance;Sit to/from stand;Cueing for safety;Cueing for sequencing;With adaptive equipment   Upper Body Dressing : Set up;Sitting   Lower Body Dressing: Moderate assistance;Sit to/from stand;Cueing for safety;Cueing for sequencing;With adaptive equipment   Toilet Transfer: Minimal assistance;RW;Ambulation   Toileting- Clothing Manipulation and Hygiene: Minimal assistance;Sit to/from stand;Cueing  for safety;Cueing for sequencing         General ADL Comments: pt may need AE or wife will A               Pertinent Vitals/Pain Pain Assessment: 0-10 Pain Score: 3  Pain Location: L hip Pain Descriptors / Indicators: Sore Pain Intervention(s): Monitored during session     Hand Dominance     Extremity/Trunk Assessment Upper Extremity Assessment Upper Extremity Assessment: Overall WFL for tasks assessed           Communication Communication Communication: No difficulties   Cognition Arousal/Alertness: Awake/alert Behavior During Therapy: WFL for tasks assessed/performed Overall Cognitive Status: Within Functional Limits for tasks assessed                        Exercises Exercises: Total Joint          Home Living Family/patient expects to be discharged to:: Private residence Living Arrangements: Spouse/significant other Available Help at Discharge: Family Type of Home: House Home Access: Stairs to enter Technical brewer of Steps: 1   Home Layout: One level     Bathroom Shower/Tub: Chief Strategy Officer: None          Prior Functioning/Environment Level of Independence: Independent                 OT Problem List: Decreased strength;Pain;Decreased knowledge of use of DME or AE   OT  Treatment/Interventions: Self-care/ADL training;DME and/or AE instruction;Patient/family education    OT Goals(Current goals can be found in the care plan section) Acute Rehab OT Goals Patient Stated Goal: Regain IND OT Goal Formulation: With patient Time For Goal Achievement: 01/04/17 Potential to Achieve Goals: Good  OT Frequency: Min 2X/week   Barriers to Perez/C:            Co-evaluation              End of Session Equipment Utilized During Treatment: Surveyor, mining Communication: Mobility status  Activity Tolerance: Patient tolerated treatment well Patient left: in chair;with call bell/phone within  reach;with family/visitor present   Time: 1120-1140 OT Time Calculation (min): 20 min Charges:  OT General Charges $OT Visit: 1 Procedure OT Evaluation $OT Eval Moderate Complexity: 1 Procedure G-Codes:    Mathew Perez Jan 09, 2017, 2:46 PM

## 2016-12-21 NOTE — Discharge Instructions (Signed)

## 2016-12-21 NOTE — Progress Notes (Signed)
Subjective: 1 Day Post-Op Procedure(s) (LRB): LEFT TOTAL HIP ARTHROPLASTY ANTERIOR APPROACH (Left) Patient reports pain as moderate.    Objective: Vital signs in last 24 hours: Temp:  [97.3 F (36.3 C)-98.3 F (36.8 C)] 97.9 F (36.6 C) (01/06 1005) Pulse Rate:  [68-86] 86 (01/06 1005) Resp:  [12-16] 15 (01/06 1005) BP: (113-129)/(63-78) 129/70 (01/06 1005) SpO2:  [97 %-100 %] 100 % (01/06 1005)  Intake/Output from previous day: 01/05 0701 - 01/06 0700 In: 4046.3 [P.O.:940; I.V.:3051.3; IV Piggyback:55] Out: 1200 [Urine:900; Blood:300] Intake/Output this shift: Total I/O In: 120 [P.O.:120] Out: 150 [Urine:150]   Recent Labs  12/21/16 0506  HGB 11.4*    Recent Labs  12/21/16 0506  WBC 8.1  RBC 3.73*  HCT 33.8*  PLT 211    Recent Labs  12/21/16 0506  NA 135  K 4.0  CL 103  CO2 26  BUN 18  CREATININE 0.92  GLUCOSE 103*  CALCIUM 7.7*   No results for input(s): LABPT, INR in the last 72 hours.  Sensation intact distally Intact pulses distally Dorsiflexion/Plantar flexion intact Incision: scant drainage  Assessment/Plan: 1 Day Post-Op Procedure(s) (LRB): LEFT TOTAL HIP ARTHROPLASTY ANTERIOR APPROACH (Left) Up with therapy Plan for discharge tomorrow Discharge home with home health  Mcarthur Rossetti 12/21/2016, 11:53 AM

## 2016-12-21 NOTE — Progress Notes (Signed)
Physical Therapy Treatment Patient Details Name: Mathew Perez MRN: [id number] DOB: 1951-10-29 Today's Date: 12/21/2016    History of Present Illness Pt s/p L THR    PT Comments    Pt continues to progress with mobility and hopeful for dc home tomorrow.  Follow Up Recommendations  Home health PT     Equipment Recommendations  Rolling walker with 5" wheels    Recommendations for Other Services OT consult     Precautions / Restrictions Precautions Precautions: Fall Restrictions Weight Bearing Restrictions: No Other Position/Activity Restrictions: WBAT    Mobility  Bed Mobility Overal bed mobility: Needs Assistance Bed Mobility: Supine to Sit;Sit to Supine     Supine to sit: Min guard Sit to supine: Min guard   General bed mobility comments: cues for sequence.  Pt self assisting L LE with R LE  Transfers Overall transfer level: Needs assistance Equipment used: Rolling walker (2 wheeled) Transfers: Sit to/from Stand Sit to Stand: Min guard Stand pivot transfers: Min guard       General transfer comment: VC for hand placement  Ambulation/Gait Ambulation/Gait assistance: Min guard Ambulation Distance (Feet): 250 Feet Assistive device: Rolling walker (2 wheeled) Gait Pattern/deviations: Step-to pattern;Step-through pattern;Decreased step length - right;Decreased step length - left;Shuffle;Trunk flexed Gait velocity: decr Gait velocity interpretation: Below normal speed for age/gender General Gait Details: cues for sequence, posture, ER on L and position from [employer]            Wheelchair Mobility    Modified Rankin (Stroke Patients Only)       Balance                                    Cognition Arousal/Alertness: Awake/alert Behavior During Therapy: WFL for tasks assessed/performed Overall Cognitive Status: Within Functional Limits for tasks assessed                      Exercises Total Joint Exercises Ankle  Circles/Pumps: AROM;Both;15 reps;Supine Quad Sets: AROM;Both;10 reps;Supine Heel Slides: AAROM;Left;20 reps;Supine Hip ABduction/ADduction: AAROM;Left;15 reps;Supine    General Comments        Pertinent Vitals/Pain Pain Assessment: 0-10 Pain Score: 3  Pain Location: L hip Pain Descriptors / Indicators: Sore Pain Intervention(s): Limited activity within patient's tolerance;Monitored during session;Premedicated before session;Ice applied    Home Living Family/patient expects to be discharged to:: Private residence Living Arrangements: Spouse/significant other Available Help at Discharge: Family Type of Home: House Home Access: Stairs to enter   Home Layout: One level Home Equipment: None      Prior Function Level of Independence: Independent          PT Goals (current goals can now be found in the care plan section) Acute Rehab PT Goals Patient Stated Goal: Regain IND PT Goal Formulation: With patient Time For Goal Achievement: 12/23/16 Potential to Achieve Goals: Good Progress towards PT goals: Progressing toward goals    Frequency    7X/week      PT Plan Current plan remains appropriate    Co-evaluation             End of Session Equipment Utilized During Treatment: Gait belt Activity Tolerance: Patient tolerated treatment well Patient left: in bed;with call bell/phone within reach;with family/visitor present     Time: [id number] PT Time Calculation (min) (ACUTE ONLY): 38 min  Charges:  $Gait Training: 23-37 mins $Therapeutic Exercise: 8-22 mins  G Codes:      Demitrios Molyneux 01/12/2017, 4:53 PM

## 2016-12-21 NOTE — Progress Notes (Signed)
Physical Therapy Treatment Patient Details Name: Mathew Perez MRN: [id number] DOB: [birth year] Today's Date: 12/21/2016    History of Present Illness Pt s/p L THR    PT Comments    Pt ltd by ongoing nausea but progressing well with mobility.  Follow Up Recommendations  Home health PT     Equipment Recommendations  Rolling walker with 5" wheels    Recommendations for Other Services OT consult     Precautions / Restrictions Precautions Precautions: Fall Restrictions Weight Bearing Restrictions: No Other Position/Activity Restrictions: WBAT    Mobility  Bed Mobility Overal bed mobility: Needs Assistance Bed Mobility: Supine to Sit     Supine to sit: Min assist     General bed mobility comments: cues for sequence and use of R LE to self assist  Transfers Overall transfer level: Needs assistance Equipment used: Rolling walker (2 wheeled) Transfers: Sit to/from Stand Sit to Stand: Min assist;From elevated surface         General transfer comment: cues for LE management and use of UEs to self assist  Ambulation/Gait Ambulation/Gait assistance: Min assist;Min guard Ambulation Distance (Feet): 130 Feet Assistive device: Rolling walker (2 wheeled) Gait Pattern/deviations: Step-to pattern;Decreased step length - right;Decreased step length - left;Shuffle;Trunk flexed Gait velocity: decr Gait velocity interpretation: Below normal speed for age/gender General Gait Details: cues for sequence, posture, ER on L and position from [employer]            Wheelchair Mobility    Modified Rankin (Stroke Patients Only)       Balance Overall balance assessment: No apparent balance deficits (not formally assessed)                                  Cognition Arousal/Alertness: Awake/alert Behavior During Therapy: WFL for tasks assessed/performed Overall Cognitive Status: Within Functional Limits for tasks assessed                       Exercises Total Joint Exercises Ankle Circles/Pumps: AROM;Both;15 reps;Supine Quad Sets: AROM;Both;10 reps;Supine Heel Slides: AAROM;Left;20 reps;Supine Hip ABduction/ADduction: AAROM;Left;15 reps;Supine    General Comments        Pertinent Vitals/Pain Pain Assessment: 0-10 Pain Score: 4  Pain Location: L hip Pain Descriptors / Indicators: Aching;Sore Pain Intervention(s): Limited activity within patient's tolerance;Monitored during session;Premedicated before session;Ice applied    Home Living                      Prior Function            PT Goals (current goals can now be found in the care plan section) Acute Rehab PT Goals Patient Stated Goal: Regain IND PT Goal Formulation: With patient Time For Goal Achievement: 12/23/16 Potential to Achieve Goals: Good Progress towards PT goals: Progressing toward goals    Frequency    7X/week      PT Plan Current plan remains appropriate    Co-evaluation             End of Session Equipment Utilized During Treatment: Gait belt Activity Tolerance: Patient tolerated treatment well Patient left: in chair;with call bell/phone within reach;with chair alarm set;with family/visitor present     Time: 1022-1100 PT Time Calculation (min) (ACUTE ONLY): 38 min  Charges:  $Gait Training: 23-37 mins $Therapeutic Exercise: 8-22 mins  G Codes:      Daryana Whirley 01/02/17, 12:34 PM

## 2016-12-22 LAB — CBC
HCT: 34.8 % — ABNORMAL LOW (ref 39.0–52.0)
HEMOGLOBIN: 11.7 g/dL — AB (ref 13.0–17.0)
MCH: 30.5 pg (ref 26.0–34.0)
MCHC: 33.6 g/dL (ref 30.0–36.0)
MCV: 90.6 fL (ref 78.0–100.0)
Platelets: 206 10*3/uL (ref 150–400)
RBC: 3.84 MIL/uL — AB (ref 4.22–5.81)
RDW: 13.4 % (ref 11.5–15.5)
WBC: 7.5 10*3/uL (ref 4.0–10.5)

## 2016-12-22 MED ORDER — HYDROCODONE-ACETAMINOPHEN 5-325 MG PO TABS: 1.0000 | ORAL_TABLET | ORAL | 0 refills | Status: AC | PRN

## 2016-12-22 MED ORDER — TRAMADOL HCL 50 MG PO TABS: 100.0000 mg | ORAL_TABLET | Freq: Four times a day (QID) | ORAL | 0 refills | Status: AC | PRN

## 2016-12-22 NOTE — Progress Notes (Signed)
Occupational Therapy Treatment Patient Details Name: Mathew Perez MRN: [id number] DOB: [birth year] Today's Date: 12/22/2016    History of present illness Pt s/p L THR   OT comments  Pt doing well. Education provided on safety with 3in1 transfers today as well as how to safely perform LB dressing with wife assisting PRN. Discussed how to properly adjust 3in1 for his height.    Follow Up Recommendations  No OT follow up    Equipment Recommendations  3 in 1 bedside commode    Recommendations for Other Services      Precautions / Restrictions Precautions Precautions: Fall Restrictions Weight Bearing Restrictions: No Other Position/Activity Restrictions: WBAT       Mobility Bed Mobility               General bed mobility comments: in chair  Transfers Overall transfer level: Needs assistance Equipment used: Rolling walker (2 wheeled) Transfers: Sit to/from Stand Sit to Stand: Min guard         General transfer comment: verbal cues for hand placement    Balance                                   ADL                           Toilet Transfer: Min guard;Ambulation;BSC;RW             General ADL Comments: Pt has a tub and preferes to take a shower standing in the tub once ready and not have to use a tubseat. Discussed recommendation to sponge bathe initially as pt reports L LE is still very stiff and he cant yet lift his L LE high enough to safely clear a tub edge. Did review with pt the proper sequence for stepping in and out of the tub when ready and wife present. Pt does need min cues for how to safely manuever the walker and with turns in tight spaces. Educated on LB dressing and pt and wife state they do not wish to purchase AE at this time. Encouraged pt to try to reach himself to don pants, etc over L LE and have help if he needs it. Instructed on having walker in front of him when he stands to pull up clothing.       Vision                      Perception     Praxis      Cognition   Behavior During Therapy: WFL for tasks assessed/performed Overall Cognitive Status: Within Functional Limits for tasks assessed                       Extremity/Trunk Assessment               Exercises     Shoulder Instructions       General Comments      Pertinent Vitals/ Pain       Pain Assessment: 0-10 Pain Score: 5  Pain Location: L hip Pain Descriptors / Indicators: Tightness Pain Intervention(s): Monitored during session  Home Living  Prior Functioning/Environment              Frequency  Min 2X/week        Progress Toward Goals  OT Goals(current goals can now be found in the care plan section)  Progress towards OT goals: Progressing toward goals     Plan Discharge plan remains appropriate    Co-evaluation                 End of Session Equipment Utilized During Treatment: Rolling walker   Activity Tolerance Patient tolerated treatment well   Patient Left  (with PT standing)   Nurse Communication          [id number] OT Time Calculation (min): 28 min  Charges: OT General Charges $OT Visit: 1 Procedure OT Treatments $Self Care/Home Management : 8-22 mins $Therapeutic Activity: 8-22 mins  Jules Schick  [id number] 12/22/2016, 10:29 AM

## 2016-12-22 NOTE — Progress Notes (Signed)
Patient discharged home - DME/HH established by Case Management. IV access removed. Discharge instructions discussed with patient, verbalized understanding of all. Wife at side and able to use teach back as well. All questions answered. Follow up visits discussed. Prescriptions given. VSS. No distress at time of discharge. Patient taken via wheelchair to [place] with wife to drive him home.

## 2016-12-22 NOTE — Discharge Summary (Signed)
Discharge Diagnoses:  Principal Problem:   Unilateral primary osteoarthritis, left hip Active Problems:   Status post left hip replacement   Surgeries: Procedure(s): LEFT TOTAL HIP ARTHROPLASTY ANTERIOR APPROACH on 12/20/2016    Consultants:   Discharged Condition: Improved  Hospital Course: Mathew Perez is an 66 y.o. male who was admitted 12/20/2016 with a chief complaint of osteoarthritis left hip, with a final diagnosis of severe osteoarthritis left hip.  Patient was brought to the operating room on 12/20/2016 and underwent Procedure(s): LEFT TOTAL HIP ARTHROPLASTY ANTERIOR APPROACH.    Patient was given perioperative antibiotics: Anti-infectives    Start     Dose/Rate Route Frequency Ordered Stop   12/20/16 1400  ceFAZolin (ANCEF) IVPB 1 g/50 mL premix     1 g 100 mL/hr over 30 Minutes Intravenous Every 6 hours 12/20/16 1133 12/20/16 2111   12/20/16 0520  ceFAZolin (ANCEF) IVPB 2g/100 mL premix     2 g 200 mL/hr over 30 Minutes Intravenous On call to O.R. 12/20/16 [id number] 12/20/16 0744    .  Patient was given sequential compression devices, early ambulation, and aspirin for DVT prophylaxis.  Recent vital signs: Patient Vitals for the past 24 hrs:  BP Temp Temp src Pulse Resp SpO2  12/22/16 0512 (!) 138/96 98.4 F (36.9 C) Oral 61 16 100 %  12/22/16 0450 137/79 98 F (36.7 C) Oral 80 18 98 %  12/21/16 2147 116/67 98 F (36.7 C) Oral 80 16 96 %  12/21/16 1406 132/81 97.6 F (36.4 C) Axillary 80 16 99 %  .  Recent laboratory studies: No results found.  Discharge Medications:   Allergies as of 12/22/2016   No Known Allergies     Medication List    TAKE these medications   aspirin 81 MG chewable tablet Chew 1 tablet (81 mg total) by mouth 2 (two) times daily.   diclofenac 75 MG EC tablet Commonly known as:  VOLTAREN Take 75 mg by mouth 2 (two) times daily as needed for mild pain.   HYDROcodone-acetaminophen 5-325 MG tablet Commonly known as:  NORCO/VICODIN Take 1-2  tablets by mouth every 4 (four) hours as needed for severe pain.   ibuprofen 800 MG tablet Commonly known as:  ADVIL,MOTRIN Take 800 mg by mouth daily as needed for mild pain.   methocarbamol 500 MG tablet Commonly known as:  ROBAXIN Take 1 tablet (500 mg total) by mouth every 6 (six) hours as needed for muscle spasms.   omeprazole 40 MG capsule Commonly known as:  PRILOSEC Take 40 mg by mouth daily as needed (heartburn).   oxyCODONE-acetaminophen 5-325 MG tablet Commonly known as:  ROXICET Take 1-2 tablets by mouth every 4 (four) hours as needed.   promethazine 12.5 MG tablet Commonly known as:  PHENERGAN Take 1 tablet (12.5 mg total) by mouth every 8 (eight) hours as needed for nausea or vomiting.   traMADol 50 MG tablet Commonly known as:  ULTRAM Take 2 tablets (100 mg total) by mouth every 6 (six) hours as needed for moderate pain.            Durable Medical Equipment        Start     Ordered   12/20/16 1134  DME Walker rolling  Once    Question:  Patient needs a walker to treat with the following condition  Answer:  Status post left hip replacement   12/20/16 1133   12/20/16 1134  DME 3 n 1  Once  12/20/16 1133      Diagnostic Studies: Dg Pelvis Portable  Result Date: 12/20/2016 CLINICAL DATA:  Left anterior approach total hip replacement. History of osteoarthritis. EXAM: PORTABLE PELVIS 1-2 VIEWS COMPARISON:  12/20/2016 intraoperative images. FINDINGS: A left total hip prosthesis is in place. No visible periprosthetic fracture or other acute complicating feature. Expected gas in the soft tissues. Mild to moderate right hip degenerative arthropathy. IMPRESSION: 1. Frontal projection views of left total hip prosthesis without fracture or acute complicating feature. Electronically Signed   By: Van Clines M.D.   On: 12/20/2016 09:52   Dg C-arm 1-60 Min-no Report  Result Date: 12/20/2016 There is no Radiologist interpretation  for this exam.  Dg Hip  Operative Unilat With Pelvis Left  Result Date: 12/20/2016 CLINICAL DATA:  Left anterior hip replacement. EXAM: OPERATIVE LEFT HIP (WITH PELVIS IF PERFORMED) 5 VIEWS TECHNIQUE: Fluoroscopic spot image(s) were submitted for interpretation post-operatively. COMPARISON:  None FLUOROSCOPY TIME:  24 seconds FINDINGS: Multiple intraoperative fluoroscopic spot images are provided. Interval total left hip arthroplasty. No dislocation. IMPRESSION: Total left hip arthroplasty. Electronically Signed   By: Kathreen Devoid   On: 12/20/2016 09:01    Patient benefited maximally from their hospital stay and there were no complications.     Disposition: Final discharge disposition not confirmed Discharge Instructions    Call MD / Call 911    Complete by:  As directed    If you experience chest pain or shortness of breath, CALL 911 and be transported to the hospital emergency room.  If you develope a fever above 101 F, pus (white drainage) or increased drainage or redness at the wound, or calf pain, call your surgeon's office.   Constipation Prevention    Complete by:  As directed    Drink plenty of fluids.  Prune juice may be helpful.  You may use a stool softener, such as Colace (over the counter) 100 mg twice a day.  Use MiraLax (over the counter) for constipation as needed.   Diet - low sodium heart healthy    Complete by:  As directed    Increase activity slowly as tolerated    Complete by:  As directed      Follow-up Information    Mcarthur Rossetti, MD Follow up in 2 week(s).   Specialty:  Orthopedic Surgery Contact information: [place] [address] [phone]        [facility] Follow up.   Specialty:  [place] Why:  Home Health Physical Therapy Contact information: [place] [address] [phone]            Signed: Newt Minion 12/22/2016, 11:06 AM

## 2016-12-22 NOTE — Care Management Note (Signed)
Case Management Note  Patient Details  Name: Mathew Perez MRN: [id number] Date of Birth: 30-Apr-1951  Subjective/Objective:   S/p L THR                 Action/Plan: Discharge Planning: AVS reviewed: NCM spoke to pt's wife, and offered choice for [facility]. Agreeable to [facility]. Preoperatively arranged with [facility] from MD's office. Contacted [facility] DME rep for RW and 3n1 bedside commode for home. Will deliver to room prior to dc.   PCP Sheral Apley MD   Expected Discharge Date:  12/22/2016               Expected Discharge Plan:  [place]  In-House Referral:  NA  Discharge planning Services  CM Consult  Post Acute Care Choice:  Home Health Choice offered to:  Spouse  DME Arranged:  3-N-1, Walker rolling DME Agency:  [place]:  PT [place] Agency:  [facility] (formerly [facility])  Status of Service:  Completed, signed off  If discussed at [employer] of Stay Meetings, dates discussed:    Additional Comments:  Erenest Rasher, RN 12/22/2016, 10:26 AM

## 2016-12-22 NOTE — Progress Notes (Signed)
Physical Therapy Treatment Patient Details Name: Mathew Perez MRN: [id number] DOB: [birth year] Today's Date: 12/22/2016    History of Present Illness Pt s/p L THR    PT Comments    Pt progressing well with mobility and eager for dc home.  Spouse present and reviewed therex, stairs and car transfers.  Follow Up Recommendations  Home health PT     Equipment Recommendations  Rolling walker with 5" wheels    Recommendations for Other Services OT consult     Precautions / Restrictions Precautions Precautions: Fall Restrictions Weight Bearing Restrictions: No Other Position/Activity Restrictions: WBAT    Mobility  Bed Mobility Overal bed mobility: Needs Assistance Bed Mobility: Supine to Sit;Sit to Supine     Supine to sit: Supervision Sit to supine: Supervision   General bed mobility comments: min cues for sequence  Transfers Overall transfer level: Needs assistance Equipment used: Rolling walker (2 wheeled) Transfers: Sit to/from Stand Sit to Stand: Supervision         General transfer comment: verbal cues for hand placement  Ambulation/Gait Ambulation/Gait assistance: Min guard;Supervision Ambulation Distance (Feet): 400 Feet (and 15' into bathroom) Assistive device: Rolling walker (2 wheeled) Gait Pattern/deviations: Step-to pattern;Step-through pattern;Decreased step length - right;Decreased step length - left;Shuffle;Trunk flexed Gait velocity: decr Gait velocity interpretation: Below normal speed for age/gender General Gait Details: min cues for posture and position from RW   Stairs Stairs: Yes   Stair Management: No rails;Step to pattern;Forwards;Backwards;With walker Number of Stairs: 3 General stair comments: single step x 3 - 2 fwd and once bkwd.  cues for sequence and foot/RW placement.  Wife present and written instruction provided  Wheelchair Mobility    Modified Rankin (Stroke Patients Only)       Balance Overall balance assessment:  No apparent balance deficits (not formally assessed)                                  Cognition Arousal/Alertness: Awake/alert Behavior During Therapy: WFL for tasks assessed/performed Overall Cognitive Status: Within Functional Limits for tasks assessed                      Exercises Total Joint Exercises Ankle Circles/Pumps: AROM;Both;15 reps;Supine Quad Sets: AROM;Both;10 reps;Supine Heel Slides: AAROM;Left;20 reps;Supine Hip ABduction/ADduction: AAROM;Left;15 reps;Supine Long Arc Quad: Left;10 reps;Seated;AROM    General Comments        Pertinent Vitals/Pain Pain Assessment: 0-10 Pain Score: 3  Pain Location: L hip Pain Descriptors / Indicators: Tightness Pain Intervention(s): Limited activity within patient's tolerance;Monitored during session;Premedicated before session;Ice applied    Home Living                      Prior Function            PT Goals (current goals can now be found in the care plan section) Acute Rehab PT Goals Patient Stated Goal: Regain IND PT Goal Formulation: With patient Time For Goal Achievement: 12/23/16 Potential to Achieve Goals: Good Progress towards PT goals: Progressing toward goals    Frequency    7X/week      PT Plan Current plan remains appropriate    Co-evaluation             End of Session Equipment Utilized During Treatment: Gait belt Activity Tolerance: Patient tolerated treatment well Patient left: Other (comment) (bathroom)     Time: [id number] PT Time Calculation (min) (  ACUTE ONLY): 40 min  Charges:  $Gait Training: 8-22 mins $Therapeutic Exercise: 8-22 mins $Therapeutic Activity: 8-22 mins                    G Codes:      Mathew Perez 01-20-2017, 11:55 AM

## 2016-12-23 LAB — TYPE AND SCREEN
ABO/RH(D): A NEG
ANTIBODY SCREEN: NEGATIVE

## 2016-12-23 NOTE — Op Note (Signed)
NAME:  Mathew Perez, Mathew Perez                      ACCOUNT NO.:  MEDICAL RECORD NO.:  [id number]  LOCATION:                                 FACILITY:  PHYSICIAN:  Lind Guest. Ninfa Linden, M.D.DATE OF BIRTH:  DATE OF PROCEDURE:  12/20/2016 DATE OF DISCHARGE:                              OPERATIVE REPORT   PREOPERATIVE DIAGNOSIS:  Primary osteoarthritis and degenerative joint disease, left hip.  POSTOPERATIVE DIAGNOSIS:  Primary osteoarthritis and degenerative joint disease, left hip.  PROCEDURE:  Left total hip arthroplasty through direct anterior approach.  IMPLANTS:  DePuy Sector Gription acetabular component size 54, size 36+ 4 polyethylene liner, size 11 Corail femoral component with varus offset, size 36- 2 metal hip ball.  SURGEON:  Lind Guest. Ninfa Linden, M.D.  ASSISTANT:  Erskine Emery, PA-C.  ANESTHESIA:  Spinal.  BLOOD LOSS:  300-350 mL.  ANTIBIOTICS:  2 g IV Ancef.  COMPLICATIONS:  None.  INDICATIONS:  [name] is a very pleasant 66 year old gentleman well known to me.  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  It has detrimentally affected his activities of daily living, his quality of life, and his mobility.  At this point, he does wish to proceed with a total hip arthroplasty.  He understands our goals are decreased pain, improved mobility, and overall improved quality of life.  He understands the risk of acute blood loss anemia, nerve and vessel injury, fracture, infection, dislocation, and DVT.  PROCEDURE DESCRIPTION:  After informed consent was obtained, appropriate left hip was marked.  He was brought to the operating room.  Spinal anesthesia was obtained while he was on a stretcher.  A Foley catheter was placed.  Then, both feet had traction boots applied to them.  Next, he was placed supine on the Hana fracture table with the perineal post in place and both legs in inline with skeletal traction devices,  but no traction applied.  His left operative hip was prepped and draped with DuraPrep and sterile drapes.  A time-out was called.  He was identified as correct patient and correct left hip.  We then made an incision just inferior and posterior to the anterior superior iliac spine and carried this just slightly obliquely down the leg.  We dissected down tensor fascia lata muscle.  The tensor fascia was then divided longitudinally to proceed with a direct anterior approach to the hip.  We identified and cauterized the circumflex vessels and then identified the hip capsule.  We then opened the hip capsule in an L-type format finding a large joint effusion and significant severe periarticular osteophytes around the acetabular rim and the femoral head.  We placed Cobra retractor around the medial and lateral femoral neck and then made our femoral neck cut with an oscillating saw proximal to the lesser trochanter and completed this on osteotome.  We placed a corkscrew guide in the femoral head and removed the femoral head in its entirety and found it to be completely devoid of cartilage.  We then cleaned the acetabulum, remnants of acetabular labrum, and other debris.  I placed a bent Hohmann over the medial  acetabular rim and then began reaming from direct visualization from a size 42 reamer in stepwise increments up to a size 54 with all reamers under direct visualization, and the last reamer under direct fluoroscopy, so we could obtain our depth of reaming, our inclination, and anteversion.  We then placed the real DePuy Sector Gription acetabular component size 54 and a 36+ 4 neutral polyethylene liner for that size acetabular component.  Attention was then turned to the femur.  With the leg externally rotated to 120 degrees, extended and adducted, we were able to place a Mueller retractor medially and a Hohmann retractor behind the greater trochanter.  We released the lateral joint capsule  and used a box cutting osteotome to enter the femoral canal and a rongeur to lateralize.  We then began broaching from a size 8 broach using the Corail broaching system up to a size 11.  Based off his anatomy, we trialed a varus offset femoral neck and a 36- 2 hip ball given his tightness.  We brought the leg back over and up with traction and internal rotation reducing the pelvis, we were pleased with leg length, stability, and offset.  We also pleased with range of motion.  We then dislocated the hip and removed the trial components.  We were able to place the real Corail femoral component with varus offset size 11 and the real 36- 2 metal hip ball, reduced this in the acetabulum and again it was stable.  We then copiously irrigated with normal saline with pulsatile lavage.  We closed the joint capsule with interrupted #1 Ethibond suture, followed by running #1 Vicryl tensor fascia, 0 Vicryl in the deep tissue, 2-0 Vicryl in the subcutaneous tissue, 4-0 Monocryl subcuticular stitch, and Steri-Strips on the skin.  An Aquacel dressing was applied.  He was taken off the Hana table and taken to the recovery room in stable condition.  All final counts were correct.  There were no complications noted.  Of note, Erskine Emery, PA-C, assisted in the entire case.  His assistance was crucial for facilitating all aspects of this case.     Lind Guest. Ninfa Linden, M.D.     CYB/MEDQ  D:  12/20/2016  T:  12/20/2016  Job:  [id number]

## 2016-12-25 ENCOUNTER — Telehealth (INDEPENDENT_AMBULATORY_CARE_PROVIDER_SITE_OTHER): Payer: Self-pay | Admitting: *Deleted

## 2016-12-25 NOTE — Telephone Encounter (Signed)
Patient aware these were completed and faxed on the 22nd

## 2016-12-25 NOTE — Telephone Encounter (Signed)
[facility] needs verbal for 3x a week for 3 weeks.

## 2016-12-25 NOTE — Telephone Encounter (Signed)
Verbal order given  

## 2016-12-25 NOTE — Telephone Encounter (Signed)
Patients wife called in to ask if you received patients [place] papers she mailed in before christmas.  Cb#: [phone]

## 2017-01-02 ENCOUNTER — Inpatient Hospital Stay (INDEPENDENT_AMBULATORY_CARE_PROVIDER_SITE_OTHER): Payer: Self-pay | Admitting: Orthopaedic Surgery

## 2017-01-04 ENCOUNTER — Ambulatory Visit (INDEPENDENT_AMBULATORY_CARE_PROVIDER_SITE_OTHER): Payer: BLUE CROSS/BLUE SHIELD | Admitting: Orthopaedic Surgery

## 2017-01-04 ENCOUNTER — Encounter (INDEPENDENT_AMBULATORY_CARE_PROVIDER_SITE_OTHER): Payer: Self-pay | Admitting: Orthopaedic Surgery

## 2017-01-04 DIAGNOSIS — G8929 Other chronic pain: Secondary | ICD-10-CM

## 2017-01-04 DIAGNOSIS — M25561 Pain in right knee: Secondary | ICD-10-CM

## 2017-01-04 DIAGNOSIS — Z96642 Presence of left artificial hip joint: Secondary | ICD-10-CM

## 2017-01-04 MED ORDER — LIDOCAINE HCL 1 % IJ SOLN
3.0000 mL | INTRAMUSCULAR | Status: AC | PRN
  Administered 2017-01-04: 3 mL

## 2017-01-04 MED ORDER — METHYLPREDNISOLONE ACETATE 40 MG/ML IJ SUSP
40.0000 mg | INTRAMUSCULAR | Status: AC | PRN
  Administered 2017-01-04: 40 mg via INTRA_ARTICULAR

## 2017-01-04 NOTE — Progress Notes (Signed)
Office Visit Note   Patient: Mathew Perez           Date of Birth: 1951-03-21           MRN: [id number] Visit Date: 01/04/2017              Requested by: Sheral Apley, MD No address on file PCP: Sheral Apley, MD   Assessment & Plan: Visit Diagnoses:  1. Status post left hip replacement   2. Chronic pain of right knee     Plan:  He'll continue to increase his activities as comfort allows. He tolerated the right knee steroid injection well. I'll see him back in a month to see how is doing overall but no x-rays are needed. However he still having problems with his right knee we may need to obtain an AP and lateral of the right knee.  Follow-Up Instructions: Return in about 4 weeks (around 02/01/2017).   Orders:  Orders Placed This Encounter  Procedures  . Large Joint Injection/Arthrocentesis   No orders of the defined types were placed in this encounter.     Procedures: Large Joint Inj Date/Time: 01/04/2017 10:35 AM Performed by: Mcarthur Rossetti Authorized by: Mcarthur Rossetti   Location:  Knee Site:  R knee Ultrasound Guidance: No   Fluoroscopic Guidance: No   Arthrogram: No   Medications:  3 mL lidocaine 1 %; 40 mg methylPREDNISolone acetate 40 MG/ML     Clinical Data: No additional findings.   Subjective: Chief Complaint  Patient presents with  . Left Hip - Routine Post Op  . Follow-up    HPI He reports that he is doing well from his hip replacement. This is on the left side. He does have some chronic right knee pain and swelling. Is been going to home health therapy and is not taking medications at this point. He has been on aspirin twice daily. Review of Systems   Objective: Vital Signs: There were no vitals taken for this visit.  Physical Exam  Ortho Exam His right knee does show some mild swelling with painful range of motion mainly in the posterior aspects but his ligaments are stable. His left operative hip is well-healed  surgical incision and the Steri-Strips are on their and look good. He does have a moderate seroma and I was able to drain about 90 mL of fluid from around the soft tissue of the hip. Specialty Comments:  No specialty comments available.  Imaging: No results found.   PMFS History: Patient Active Problem List   Diagnosis Date Noted  . Unilateral primary osteoarthritis, left hip 12/20/2016  . Status post left hip replacement 12/20/2016   Past Medical History:  Diagnosis Date  . Arthritis    oa  . Cancer ([place])    basal cell removed fro face 4 yrs ago  . GERD (gastroesophageal reflux disease)   . Pneumonia as child    No family history on file.  Past Surgical History:  Procedure Laterality Date  . colonscopy  2014   every 5 yrs due to polyps  . left rotator cuff repair  6 yrs ago  . TOTAL HIP ARTHROPLASTY Left 12/20/2016   Procedure: LEFT TOTAL HIP ARTHROPLASTY ANTERIOR APPROACH;  Surgeon: Mcarthur Rossetti, MD;  Location: [facility];  Service: Orthopedics;  Laterality: Left;   Social History   Occupational History  . Not on file.   Social History Main Topics  . Smoking status: Never Smoker  . Smokeless tobacco: Never Used  .  Alcohol use No  . Drug use: No  . Sexual activity: Yes

## 2017-01-23 ENCOUNTER — Other Ambulatory Visit (INDEPENDENT_AMBULATORY_CARE_PROVIDER_SITE_OTHER): Payer: Self-pay

## 2017-01-23 ENCOUNTER — Telehealth (INDEPENDENT_AMBULATORY_CARE_PROVIDER_SITE_OTHER): Payer: Self-pay | Admitting: Orthopaedic Surgery

## 2017-01-23 MED ORDER — AMOXICILLIN 500 MG PO TABS: ORAL_TABLET | ORAL | 1 refills | Status: AC

## 2017-01-23 NOTE — Telephone Encounter (Signed)
Patient having dental work in the next 2-3 weeks (cleaning, crown, and possible root canal if needed). Pt had L hip surgery 12/20/16.  Rx needed?  Please call into [facility] in [place], [place].

## 2017-01-23 NOTE — Telephone Encounter (Signed)
Called into pharmacy for patient

## 2017-02-04 ENCOUNTER — Ambulatory Visit (INDEPENDENT_AMBULATORY_CARE_PROVIDER_SITE_OTHER): Payer: BLUE CROSS/BLUE SHIELD | Admitting: Orthopaedic Surgery

## 2017-02-04 DIAGNOSIS — M1612 Unilateral primary osteoarthritis, left hip: Secondary | ICD-10-CM

## 2017-02-04 NOTE — Progress Notes (Signed)
The patient is now 6 weeks status post a left direct anterior hip replacement. He is walking without any type of assistive device and is doing well. 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 I feel that information this states that as well. I would have no restrictions for him once he gets back.  On examination of his left hip is leg lengths are equal. His incisions well-healed. There is no evidence of a seroma on exam. There is just some firmness of the muscle to be expected. There is no evidence of infection or redness.  At this point he'll continue increase his activities. 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 I'll see him back myself in a month to see how is doing overall. If he looks good at that visit he does need to be's seen back in about 6 months to a year.

## 2017-02-10 ENCOUNTER — Telehealth (INDEPENDENT_AMBULATORY_CARE_PROVIDER_SITE_OTHER): Payer: Self-pay | Admitting: Orthopaedic Surgery

## 2017-02-10 NOTE — Telephone Encounter (Signed)
See below please. 

## 2017-02-10 NOTE — Telephone Encounter (Signed)
Can you have him come in please today or tomorrow is fine

## 2017-02-10 NOTE — Telephone Encounter (Signed)
Patient had a hip replacement in January. He is feeling pain when he walks and puts weight on his left foot, he is back to using a cane and can barely walk due to the jolting pain for 3 days now. Wants to talk to you asap.  Cb#: [phone]

## 2017-02-10 NOTE — Telephone Encounter (Signed)
Called to schedule appt per last message, patient states he would like to talk to blackman on the phone first.  Cb#: [phone]

## 2017-03-04 ENCOUNTER — Ambulatory Visit (INDEPENDENT_AMBULATORY_CARE_PROVIDER_SITE_OTHER): Payer: BLUE CROSS/BLUE SHIELD | Admitting: Orthopaedic Surgery

## 2017-03-04 ENCOUNTER — Encounter (INDEPENDENT_AMBULATORY_CARE_PROVIDER_SITE_OTHER): Payer: Self-pay

## 2017-03-04 DIAGNOSIS — Z96642 Presence of left artificial hip joint: Secondary | ICD-10-CM

## 2017-03-04 NOTE — Progress Notes (Signed)
The patient is almost 3 months status post a left total hip arthroscopy. He's been having some pain over the lateral aspect of his hip and overall is been doing well. He is not taking any Medications at all but is also not doing any stretching exercises. He is scheduled to go back to work 03/24/2017.  On examination he has fluid range of motion of his left hip. He has some pain of the trochanteric area and IT band. His leg lengths are equal.  I did show stretching exercises for the IT band and trochanteric area. He can try heating pad and alternating ice and heat. He can try naproxen as well. I don't really see him back for 6 months. At that visit I would like a low AP pelvis.

## 2017-07-16 ENCOUNTER — Other Ambulatory Visit (INDEPENDENT_AMBULATORY_CARE_PROVIDER_SITE_OTHER): Payer: Self-pay | Admitting: Orthopaedic Surgery

## 2017-07-16 ENCOUNTER — Telehealth (INDEPENDENT_AMBULATORY_CARE_PROVIDER_SITE_OTHER): Payer: Self-pay

## 2017-07-16 MED ORDER — IBUPROFEN 800 MG PO TABS: 800.0000 mg | ORAL_TABLET | Freq: Three times a day (TID) | ORAL | 2 refills | Status: AC | PRN

## 2017-07-16 NOTE — Telephone Encounter (Signed)
I sent some in to his pharmacy. 

## 2017-07-16 NOTE — Telephone Encounter (Signed)
Please advise 

## 2017-07-16 NOTE — Telephone Encounter (Signed)
Patient would like a Rx refill for Ibuprofen 800mg .  Cb# is [phone].  Please advise.  Thank You.

## 2017-09-04 ENCOUNTER — Ambulatory Visit (INDEPENDENT_AMBULATORY_CARE_PROVIDER_SITE_OTHER): Payer: BLUE CROSS/BLUE SHIELD | Admitting: Orthopaedic Surgery

## 2018-05-04 IMAGING — DX DG PORTABLE PELVIS
2 series · 2 of 2 positions shown · non-contrast
Comparison: 12/20/2016 intraoperative images.

CLINICAL DATA: Left anterior approach total hip replacement.
History of osteoarthritis.

EXAM:
PORTABLE PELVIS 1-2 VIEWS

[pelvis ap (1 of 2)]
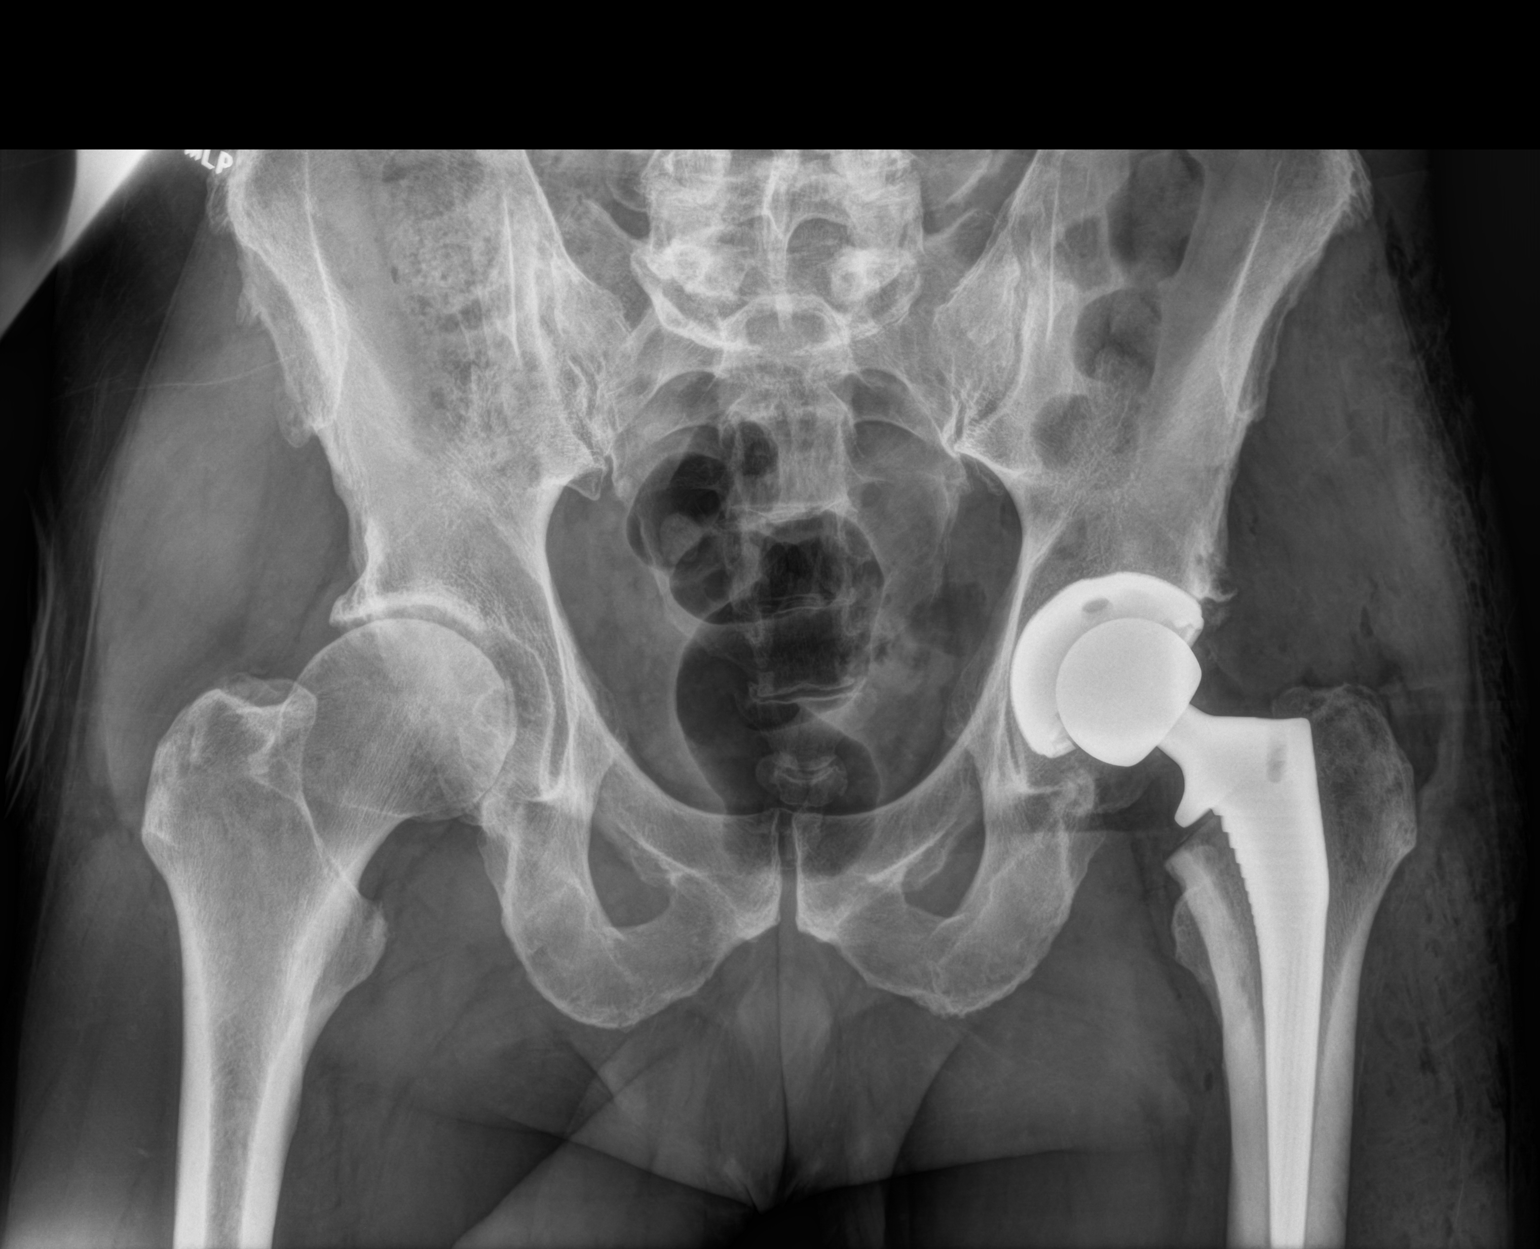

[pelvis ap (2 of 2)]
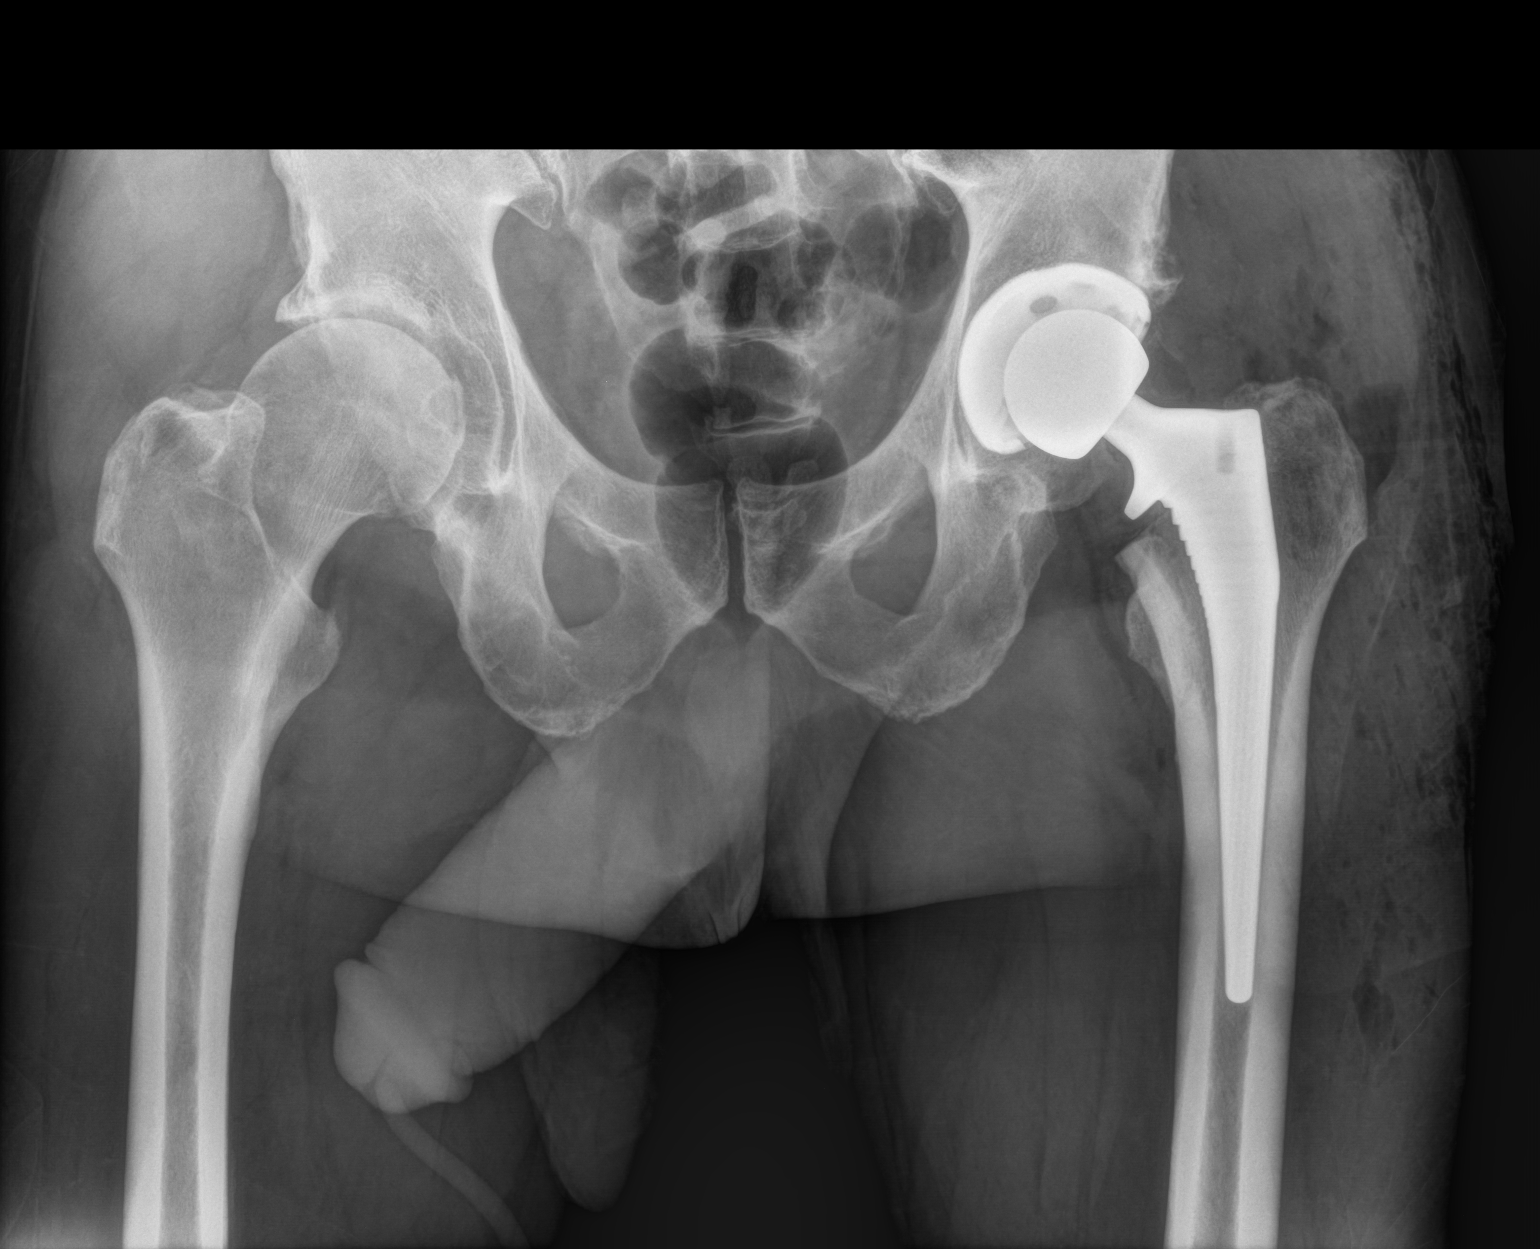

[2 of 2 positions shown; findings below may reference images not displayed]

FINDINGS: A left total hip prosthesis is in place. No visible periprosthetic
fracture or other acute complicating feature. Expected gas in the
soft tissues. Mild to moderate right hip degenerative arthropathy.
IMPRESSION: 1. Frontal projection views of left total hip prosthesis without
fracture or acute complicating feature.
